# Patient Record
Sex: Male | Born: 1975 | Race: Black or African American | Hispanic: No | Marital: Married | Smoking: Current every day smoker
Health system: Southern US, Community
[De-identification: ages and names within clinical notes are randomized; demographics above are authoritative.]

## PROBLEM LIST (undated history)

## (undated) DIAGNOSIS — I10 Essential (primary) hypertension: Secondary | ICD-10-CM

## (undated) HISTORY — PX: ROTATOR CUFF REPAIR: SHX139

---

## 2013-10-07 ENCOUNTER — Emergency Department (HOSPITAL_COMMUNITY)
Admission: EM | Admit: 2013-10-07 | Discharge: 2013-10-08 | Disposition: A | Discharge status: Home / Self Care | Payer: BC Managed Care – PPO | Attending: Emergency Medicine | Admitting: Emergency Medicine

## 2013-10-07 ENCOUNTER — Encounter (HOSPITAL_COMMUNITY): Payer: Self-pay | Admitting: Emergency Medicine

## 2013-10-07 ENCOUNTER — Emergency Department (HOSPITAL_COMMUNITY): Payer: BC Managed Care – PPO

## 2013-10-07 DIAGNOSIS — IMO0002 Reserved for concepts with insufficient information to code with codable children: Secondary | ICD-10-CM | POA: Insufficient documentation

## 2013-10-07 DIAGNOSIS — T07XXXA Unspecified multiple injuries, initial encounter: Secondary | ICD-10-CM

## 2013-10-07 DIAGNOSIS — S81009A Unspecified open wound, unspecified knee, initial encounter: Secondary | ICD-10-CM | POA: Insufficient documentation

## 2013-10-07 DIAGNOSIS — F17200 Nicotine dependence, unspecified, uncomplicated: Secondary | ICD-10-CM | POA: Insufficient documentation

## 2013-10-07 DIAGNOSIS — Y92410 Unspecified street and highway as the place of occurrence of the external cause: Secondary | ICD-10-CM | POA: Insufficient documentation

## 2013-10-07 DIAGNOSIS — Y9389 Activity, other specified: Secondary | ICD-10-CM | POA: Insufficient documentation

## 2013-10-07 DIAGNOSIS — S81019A Laceration without foreign body, unspecified knee, initial encounter: Secondary | ICD-10-CM

## 2013-10-07 DIAGNOSIS — I10 Essential (primary) hypertension: Secondary | ICD-10-CM | POA: Insufficient documentation

## 2013-10-07 DIAGNOSIS — S91009A Unspecified open wound, unspecified ankle, initial encounter: Principal | ICD-10-CM

## 2013-10-07 DIAGNOSIS — S81809A Unspecified open wound, unspecified lower leg, initial encounter: Principal | ICD-10-CM

## 2013-10-07 HISTORY — DX: Essential (primary) hypertension: I10

## 2013-10-07 LAB — CBC WITH DIFFERENTIAL/PLATELET
BASOS ABS: 0 10*3/uL (ref 0.0–0.1)
BASOS PCT: 0 % (ref 0–1)
EOS ABS: 0 10*3/uL (ref 0.0–0.7)
Eosinophils Relative: 0 % (ref 0–5)
HEMATOCRIT: 44.1 % (ref 39.0–52.0)
HEMOGLOBIN: 14.8 g/dL (ref 13.0–17.0)
Lymphocytes Relative: 10 % — ABNORMAL LOW (ref 12–46)
Lymphs Abs: 1.4 10*3/uL (ref 0.7–4.0)
MCH: 30 pg (ref 26.0–34.0)
MCHC: 33.6 g/dL (ref 30.0–36.0)
MCV: 89.3 fL (ref 78.0–100.0)
MONO ABS: 1.8 10*3/uL — AB (ref 0.1–1.0)
MONOS PCT: 13 % — AB (ref 3–12)
NEUTROS ABS: 11.2 10*3/uL — AB (ref 1.7–7.7)
Neutrophils Relative %: 78 % — ABNORMAL HIGH (ref 43–77)
Platelets: 260 10*3/uL (ref 150–400)
RBC: 4.94 MIL/uL (ref 4.22–5.81)
RDW: 15.2 % (ref 11.5–15.5)
WBC: 14.4 10*3/uL — ABNORMAL HIGH (ref 4.0–10.5)

## 2013-10-07 LAB — BASIC METABOLIC PANEL
BUN: 12 mg/dL (ref 6–23)
CHLORIDE: 105 meq/L (ref 96–112)
CO2: 24 mEq/L (ref 19–32)
CREATININE: 0.79 mg/dL (ref 0.50–1.35)
Calcium: 8.5 mg/dL (ref 8.4–10.5)
Glucose, Bld: 113 mg/dL — ABNORMAL HIGH (ref 70–99)
Potassium: 3.7 mEq/L (ref 3.7–5.3)
Sodium: 141 mEq/L (ref 137–147)

## 2013-10-07 MED ORDER — BACITRACIN ZINC 500 UNIT/GM EX OINT
TOPICAL_OINTMENT | CUTANEOUS | Status: AC
  Administered 2013-10-07: 1 via TOPICAL
  Filled 2013-10-07: qty 4.5

## 2013-10-07 MED ORDER — AMLODIPINE BESYLATE 5 MG PO TABS
10.0000 mg | ORAL_TABLET | Freq: Once | ORAL | Status: AC
  Administered 2013-10-07: 10 mg via ORAL
  Filled 2013-10-07: qty 2

## 2013-10-07 MED ORDER — HYDROMORPHONE HCL PF 1 MG/ML IJ SOLN
2.0000 mg | Freq: Once | INTRAMUSCULAR | Status: AC
  Administered 2013-10-07: 2 mg via INTRAVENOUS
  Filled 2013-10-07: qty 2

## 2013-10-07 MED ORDER — LISINOPRIL 10 MG PO TABS: 40.0000 mg | ORAL_TABLET | Freq: Every day | ORAL | Status: DC

## 2013-10-07 MED ORDER — METOPROLOL TARTRATE 50 MG PO TABS
100.0000 mg | ORAL_TABLET | Freq: Once | ORAL | Status: AC
  Administered 2013-10-07: 100 mg via ORAL
  Filled 2013-10-07: qty 2

## 2013-10-07 MED ORDER — ONDANSETRON HCL 4 MG/2ML IJ SOLN
INTRAMUSCULAR | Status: AC
  Filled 2013-10-07: qty 2

## 2013-10-07 MED ORDER — AMLODIPINE BESYLATE 5 MG PO TABS: 10.0000 mg | ORAL_TABLET | Freq: Every day | ORAL | Status: DC

## 2013-10-07 MED ORDER — KETOROLAC TROMETHAMINE 30 MG/ML IJ SOLN
30.0000 mg | Freq: Once | INTRAMUSCULAR | Status: AC
  Administered 2013-10-07: 30 mg via INTRAVENOUS
  Filled 2013-10-07: qty 1

## 2013-10-07 MED ORDER — HYDROMORPHONE HCL PF 1 MG/ML IJ SOLN
1.0000 mg | Freq: Once | INTRAMUSCULAR | Status: AC
  Administered 2013-10-07: 1 mg via INTRAVENOUS
  Filled 2013-10-07: qty 1

## 2013-10-07 MED ORDER — SODIUM CHLORIDE 0.9 % IV BOLUS (SEPSIS)
1000.0000 mL | Freq: Once | INTRAVENOUS | Status: AC
  Administered 2013-10-07: 1000 mL via INTRAVENOUS

## 2013-10-07 MED ORDER — ONDANSETRON HCL 4 MG/2ML IJ SOLN
4.0000 mg | Freq: Once | INTRAMUSCULAR | Status: AC
  Administered 2013-10-07: 4 mg via INTRAVENOUS
  Filled 2013-10-07: qty 2

## 2013-10-07 MED ORDER — LIDOCAINE-EPINEPHRINE 2 %-1:100000 IJ SOLN
20.0000 mL | Freq: Once | INTRAMUSCULAR | Status: DC
  Filled 2013-10-07: qty 20

## 2013-10-07 MED ORDER — METOPROLOL TARTRATE 50 MG PO TABS: 100.0000 mg | ORAL_TABLET | Freq: Two times a day (BID) | ORAL | Status: DC

## 2013-10-07 MED ORDER — LISINOPRIL 10 MG PO TABS
40.0000 mg | ORAL_TABLET | Freq: Once | ORAL | Status: AC
  Administered 2013-10-07: 40 mg via ORAL
  Filled 2013-10-07: qty 4

## 2013-10-07 MED ORDER — HYDROMORPHONE HCL PF 1 MG/ML IJ SOLN
INTRAMUSCULAR | Status: AC
  Filled 2013-10-07: qty 1

## 2013-10-07 MED ORDER — HYDROMORPHONE HCL PF 1 MG/ML IJ SOLN
1.0000 mg | Freq: Once | INTRAMUSCULAR | Status: AC
  Administered 2013-10-07: 1 mg via INTRAVENOUS

## 2013-10-07 MED ORDER — LORAZEPAM 2 MG/ML IJ SOLN
INTRAMUSCULAR | Status: AC
  Filled 2013-10-07: qty 1

## 2013-10-07 MED ORDER — OXYCODONE-ACETAMINOPHEN 7.5-325 MG PO TABS: 1.0000 | ORAL_TABLET | ORAL | Status: AC | PRN

## 2013-10-07 MED ORDER — SODIUM CHLORIDE 0.9 % IV SOLN
INTRAVENOUS | Status: DC
  Administered 2013-10-07: 20:00:00 via INTRAVENOUS

## 2013-10-07 MED ORDER — BACITRACIN ZINC 500 UNIT/GM EX OINT
TOPICAL_OINTMENT | Freq: Once | CUTANEOUS | Status: AC
  Administered 2013-10-07: 1 via TOPICAL
  Filled 2013-10-07: qty 5.4

## 2013-10-07 MED ORDER — LIDOCAINE-EPINEPHRINE (PF) 2 %-1:200000 IJ SOLN
INTRAMUSCULAR | Status: AC
  Administered 2013-10-07: 10 mL
  Filled 2013-10-07: qty 20

## 2013-10-07 MED ORDER — LORAZEPAM 2 MG/ML IJ SOLN
1.0000 mg | Freq: Once | INTRAMUSCULAR | Status: AC
  Administered 2013-10-07: 1 mg via INTRAVENOUS
  Filled 2013-10-07: qty 1

## 2013-10-07 MED ORDER — METOPROLOL TARTRATE 1 MG/ML IV SOLN
5.0000 mg | Freq: Once | INTRAVENOUS | Status: AC
  Administered 2013-10-07: 5 mg via INTRAVENOUS
  Filled 2013-10-07: qty 5
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
## 2013-10-07 NOTE — Discharge Instructions (Signed)
Have sutures removed in 7 days Abrasion An abrasion is a cut or scrape of the skin. Abrasions do not extend through all layers of the skin and most heal within 10 days. It is important to care for your abrasion properly to prevent infection. CAUSES  Most abrasions are caused by falling on, or gliding across, the ground or other surface. When your skin rubs on something, the outer and inner layer of skin rubs off, causing an abrasion. DIAGNOSIS  Your caregiver will be able to diagnose an abrasion during a physical exam.  TREATMENT  Your treatment depends on how large and deep the abrasion is. Generally, your abrasion will be cleaned with water and a mild soap to remove any dirt or debris. An antibiotic ointment may be put over the abrasion to prevent an infection. A bandage (dressing) may be wrapped around the abrasion to keep it from getting dirty.  You may need a tetanus shot if:  You cannot remember when you had your last tetanus shot.  You have never had a tetanus shot.  The injury broke your skin. If you get a tetanus shot, your arm may swell, get red, and feel warm to the touch. This is common and not a problem. If you need a tetanus shot and you choose not to have one, there is a rare chance of getting tetanus. Sickness from tetanus can be serious.  HOME CARE INSTRUCTIONS   If a dressing was applied, change it at least once a day or as directed by your caregiver. If the bandage sticks, soak it off with warm water.   Wash the area with water and a mild soap to remove all the ointment 2 times a day. Rinse off the soap and pat the area dry with a clean towel.   Reapply any ointment as directed by your caregiver. This will help prevent infection and keep the bandage from sticking. Use gauze over the wound and under the dressing to help keep the bandage from sticking.   Change your dressing right away if it becomes wet or dirty.   Only take over-the-counter or prescription medicines  for pain, discomfort, or fever as directed by your caregiver.   Follow up with your caregiver within 24 48 hours for a wound check, or as directed. If you were not given a wound-check appointment, look closely at your abrasion for redness, swelling, or pus. These are signs of infection. SEEK IMMEDIATE MEDICAL CARE IF:   You have increasing pain in the wound.   You have redness, swelling, or tenderness around the wound.   You have pus coming from the wound.   You have a fever or persistent symptoms for more than 2 3 days.  You have a fever and your symptoms suddenly get worse.  You have a bad smell coming from the wound or dressing.  MAKE SURE YOU:   Understand these instructions.  Will watch your condition.  Will get help right away if you are not doing well or get worse. Document Released: 04/28/2005 Document Revised: 07/05/2012 Document Reviewed: 06/22/2011 [facility] Patient Information 2014 [place], [place]. Sutured Wound Care Sutures are stitches that can be used to close wounds. Wound care helps prevent pain and infection.  HOME CARE INSTRUCTIONS   Rest and elevate the injured area until all the pain and swelling are gone.  Only take over-the-counter or prescription medicines for pain, discomfort, or fever as directed by your caregiver.  After 48 hours, gently wash the area with mild  soap and water once a day, or as directed. Rinse off the soap. Pat the area dry with a clean towel. Do not rub the wound. This may cause bleeding.  Follow your caregiver's instructions for how often to change the bandage (dressing). Stop using a dressing after 2 days or after the wound stops draining.  If the dressing sticks, moisten it with soapy water and gently remove it.  Apply ointment on the wound as directed.  Avoid stretching a sutured wound.  Drink enough fluids to keep your urine clear or pale yellow.  Follow up with your caregiver for suture removal as directed.  Use  sunscreen on your wound for the next 3 to 6 months so the scar will not darken. SEEK IMMEDIATE MEDICAL CARE IF:   Your wound becomes red, swollen, hot, or tender.  You have increasing pain in the wound.  You have a red streak that extends from the wound.  There is pus coming from the wound.  You have a fever.  You have shaking chills.  There is a bad smell coming from the wound.  You have persistent bleeding from the wound. MAKE SURE YOU:   Understand these instructions.  Will watch your condition.  Will get help right away if you are not doing well or get worse. Document Released: 08/26/2004 Document Revised: 10/11/2011 Document Reviewed: 11/22/2010 [facility] Patient Information 2014 [place], [place].

## 2013-10-07 NOTE — ED Notes (Signed)
Cleaned abdominal and bilateral lower back abrasions w/saline.  Applied bacitracin, telfa gauze and ABD pads.  Secured w/tape.  Patient tolerated well.

## 2013-10-08 NOTE — ED Notes (Signed)
Patient with no complaints at this time. Respirations even and unlabored. Skin warm/dry. Discharge instructions reviewed with patient at this time. Patient given opportunity to voice concerns/ask questions. IV removed per policy and band-aid applied to site. Patient discharged at this time and left Emergency Department with steady gait.  
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
## 2013-10-08 NOTE — ED Provider Notes (Signed)
Medical screening examination/treatment/procedure(s) were conducted as a shared visit with non-physician practitioner(s) and myself.  I personally evaluated the patient during the encounter.   EKG Interpretation None       Toy BakerAnthony T Lorena Clearman, MD 10/08/13 1455
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
